# Patient Record
Sex: Male | Born: 2000
Health system: Southern US, Community
[De-identification: ages and names within clinical notes are randomized; demographics above are authoritative.]

## PROBLEM LIST (undated history)

## (undated) HISTORY — PX: UMBILICAL HERNIA REPAIR: SUR1181

## (undated) HISTORY — PX: HERNIA REPAIR: SHX51

---
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
## 2019-07-13 ENCOUNTER — Emergency Department (HOSPITAL_COMMUNITY)
Admission: EM | Admit: 2019-07-13 | Discharge: 2019-07-14 | Disposition: A | Discharge status: Home / Self Care | Payer: Medicaid Other | Attending: Emergency Medicine | Admitting: Emergency Medicine

## 2019-07-13 DIAGNOSIS — Y929 Unspecified place or not applicable: Secondary | ICD-10-CM | POA: Insufficient documentation

## 2019-07-13 DIAGNOSIS — W260XXA Contact with knife, initial encounter: Secondary | ICD-10-CM | POA: Diagnosis not present

## 2019-07-13 DIAGNOSIS — Y93G1 Activity, food preparation and clean up: Secondary | ICD-10-CM | POA: Diagnosis not present

## 2019-07-13 DIAGNOSIS — S61512A Laceration without foreign body of left wrist, initial encounter: Secondary | ICD-10-CM | POA: Diagnosis not present

## 2019-07-13 DIAGNOSIS — Y999 Unspecified external cause status: Secondary | ICD-10-CM | POA: Insufficient documentation
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
## 2019-08-27 ENCOUNTER — Other Ambulatory Visit: Payer: Self-pay

## 2019-08-27 ENCOUNTER — Emergency Department (HOSPITAL_COMMUNITY)
Admission: EM | Admit: 2019-08-27 | Discharge: 2019-08-27 | Discharge status: Against Medical Advice | Payer: Medicaid Other

## 2019-08-27 NOTE — ED Notes (Signed)
Pt brought back to triage, he took his bandage off and started bleeding. Laceration noted to L middle finger. Pt would not stay still for a proper assessment. We rewrapped it and he stated that he was not waiting to be seen. Pt encouraged to stay. He said that he would come back next week. Pt was educated that there is a time frame in which he can get stitched. He said "fuck that I'm not waiting." Pt once again encouraged to stay d/t infection risk and potential healing issues.

## 2019-09-04 ENCOUNTER — Ambulatory Visit
Admission: EM | Admit: 2019-09-04 | Discharge: 2019-09-04 | Disposition: A | Discharge status: Home / Self Care | Payer: Medicaid Other | Attending: Emergency Medicine | Admitting: Emergency Medicine

## 2019-09-04 DIAGNOSIS — S61219A Laceration without foreign body of unspecified finger without damage to nail, initial encounter: Secondary | ICD-10-CM

## 2019-09-04 DIAGNOSIS — Z23 Encounter for immunization: Secondary | ICD-10-CM

## 2019-09-04 MED ORDER — TETANUS-DIPHTH-ACELL PERTUSSIS 5-2.5-18.5 LF-MCG/0.5 IM SUSP
0.5000 mL | Freq: Once | INTRAMUSCULAR | Status: AC
  Administered 2019-09-04: 0.5 mL via INTRAMUSCULAR

## 2019-09-04 NOTE — ED Provider Notes (Signed)
[facility]    CSN: [id number] Arrival date & time: 09/04/19  1010      History   Chief Complaint Chief Complaint  Patient presents with  . Laceration    HPI James Baldwin is a 19 y.o. male presenting for left middle finger laceration.  This occurred 5/27: went to the ER, though left prior to being seen (please see ED note).  Patient unsure of last tetanus.  Endorsing mild pain, did not take anything for this.  No fever, numbness.  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   History reviewed. No pertinent past medical history.  There are no problems to display for this patient.   Past Surgical History:  Procedure Laterality Date  . UMBILICAL HERNIA REPAIR         Home Medications    Prior to Admission medications   Not on File    Family History History reviewed. No pertinent family history.  Social History Social History   Tobacco Use  . Smoking status: Current Every Day Smoker    Types: Cigarettes  . Smokeless tobacco: Current User  Substance Use Topics  . Alcohol use: No  . Drug use: Yes    Types: Marijuana     Allergies   Patient has no known allergies.   Review of Systems As per HPI   Physical Exam Triage Vital Signs ED Triage Vitals  Enc Vitals Group     BP      Pulse      Resp      Temp      Temp src      SpO2      Weight      Height      Head Circumference      Peak Flow      Pain Score      Pain Loc      Pain Edu?      Excl. in GC?    No data found.  Updated Vital Signs BP 115/73 (BP Location: Left Arm)   Pulse 97   Temp 98.7 F (37.1 C) (Oral)   Resp 16   SpO2 98%   Visual Acuity Right Eye Distance:   Left Eye Distance:   Bilateral Distance:    Right Eye Near:   Left Eye Near:    Bilateral Near:     Physical Exam Constitutional:      General: He is not in acute distress. HENT:     Head: Normocephalic and atraumatic.  Eyes:     General: No scleral icterus.  Pupils: Pupils are equal, round, and reactive to light.  Cardiovascular:     Rate and Rhythm: Normal rate.  Pulmonary:     Effort: Pulmonary effort is normal. No respiratory distress.     Breath sounds: No wheezing.  Musculoskeletal:        General: Tenderness present. No swelling. Normal range of motion.  Skin:    Coloration: Skin is not jaundiced or pale.     Comments: 1.5 cm laceration noted to left middle finger.  Healing well without discharge, warmth.  NVI  Neurological:     Mental Status: He is alert and oriented to person, place, and time.      UC Treatments / Results  Labs (all labs ordered are listed, but only abnormal results are displayed) Labs Reviewed - No data to display  EKG   Radiology No results found.  Procedures Procedures (  including critical care time)  Medications Ordered in UC Medications  Tdap (BOOSTRIX) injection 0.5 mL (has no administration in time range)    Initial Impression / Assessment and Plan / UC Course  I have reviewed the triage vital signs and the nursing notes.  Pertinent labs & imaging results that were available during my care of the patient were reviewed by me and considered in my medical decision making (see chart for details).     Low concern for infectious process at this time.  We will continue secondary healing.  Tetanus updated.  Wound dressed and wound care discussed.  Return precautions discussed, patient verbalized understanding and is agreeable to plan. Final Clinical Impressions(s) / UC Diagnoses   Final diagnoses:  Finger laceration, initial encounter     Discharge Instructions     Keep wound clean and dry. Important to keep covered while at work. May take Tylenol or ibuprofen for pain. Return for worsening pain, swelling, discharge, fever.    ED Prescriptions    None     PDMP not reviewed this encounter.   Hall-Potvin, [place], [place] 09/04/19 1034

## 2019-09-04 NOTE — Discharge Instructions (Signed)
Keep wound clean and dry. Important to keep covered while at work. May take Tylenol or ibuprofen for pain. Return for worsening pain, swelling, discharge, fever.
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
## 2019-09-06 ENCOUNTER — Ambulatory Visit
Admission: EM | Admit: 2019-09-06 | Discharge: 2019-09-06 | Disposition: A | Discharge status: Home / Self Care | Payer: Medicaid Other | Attending: Emergency Medicine | Admitting: Emergency Medicine

## 2019-09-06 ENCOUNTER — Ambulatory Visit (INDEPENDENT_AMBULATORY_CARE_PROVIDER_SITE_OTHER): Payer: Medicaid Other

## 2019-09-06 ENCOUNTER — Encounter: Payer: Self-pay | Admitting: *Deleted

## 2019-09-06 ENCOUNTER — Other Ambulatory Visit: Payer: Self-pay

## 2019-09-06 DIAGNOSIS — S61411A Laceration without foreign body of right hand, initial encounter: Secondary | ICD-10-CM

## 2019-09-06 DIAGNOSIS — M79644 Pain in right finger(s): Secondary | ICD-10-CM | POA: Diagnosis not present

## 2019-09-06 DIAGNOSIS — S6991XA Unspecified injury of right wrist, hand and finger(s), initial encounter: Secondary | ICD-10-CM

## 2019-09-06 NOTE — Discharge Instructions (Signed)
Keep wound clean and dry. Important apply ice as discussed top with pain and swelling. May also take ibuprofen, Tylenol as needed. Return for worsening pain, swelling, discharge, smell, fever.
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
## 2019-10-22 ENCOUNTER — Ambulatory Visit: Payer: Medicaid Other | Admitting: Internal Medicine

## 2020-04-08 ENCOUNTER — Encounter: Payer: Self-pay | Admitting: Emergency Medicine

## 2020-04-08 ENCOUNTER — Other Ambulatory Visit: Payer: Self-pay

## 2020-04-08 ENCOUNTER — Ambulatory Visit
Admission: EM | Admit: 2020-04-08 | Discharge: 2020-04-08 | Disposition: A | Discharge status: Home / Self Care | Payer: Medicaid Other | Attending: Emergency Medicine | Admitting: Emergency Medicine

## 2020-04-08 DIAGNOSIS — B349 Viral infection, unspecified: Secondary | ICD-10-CM | POA: Diagnosis not present

## 2020-04-08 DIAGNOSIS — R52 Pain, unspecified: Secondary | ICD-10-CM | POA: Diagnosis not present

## 2020-04-08 MED ORDER — IBUPROFEN 800 MG PO TABS: 800.0000 mg | ORAL_TABLET | Freq: Three times a day (TID) | ORAL | 0 refills | Status: AC

## 2020-04-08 NOTE — ED Provider Notes (Signed)
[facility]    CSN: [id number] Arrival date & time: 04/08/20  0954      History   Chief Complaint Chief Complaint  Patient presents with  . Sore Throat  . Generalized Body Aches    HPI James Baldwin is a 20 y.o. male presenting today for evaluation of sore throat headache and body aches.  Symptoms began in the past 1 to 2 days.  Reports associated fatigue.  Denies significant cough or congestion.  Main complaint is aches in his lower legs bilaterally.   HPI  History reviewed. No pertinent past medical history.  There are no problems to display for this patient.   Past Surgical History:  Procedure Laterality Date  . HERNIA REPAIR    . UMBILICAL HERNIA REPAIR         Home Medications    Prior to Admission medications   Medication Sig Start Date End Date Taking? Authorizing Provider  ibuprofen (ADVIL) 800 MG tablet Take 1 tablet (800 mg total) by mouth 3 (three) times daily. 04/08/20  Yes Timiya Howells, Junius Creamer, PA-C    Family History Family History  Problem Relation Age of Onset  . Healthy Mother   . Healthy Father     Social History Social History   Tobacco Use  . Smoking status: Current Every Day Smoker    Types: Cigarettes  . Smokeless tobacco: Current User  Vaping Use  . Vaping Use: Never used  Substance Use Topics  . Alcohol use: No  . Drug use: Yes    Types: Marijuana     Allergies   Patient has no known allergies.   Review of Systems Review of Systems  Constitutional: Positive for fatigue. Negative for activity change, appetite change, chills and fever.  HENT: Positive for sore throat. Negative for congestion, ear pain, rhinorrhea, sinus pressure and trouble swallowing.   Eyes: Negative for discharge and redness.  Respiratory: Negative for cough, chest tightness and shortness of breath.   Cardiovascular: Negative for chest pain.  Gastrointestinal: Negative for abdominal pain, diarrhea, nausea and vomiting.  Musculoskeletal:  Negative for myalgias.  Skin: Negative for rash.  Neurological: Positive for headaches. Negative for dizziness and light-headedness.     Physical Exam Triage Vital Signs ED Triage Vitals  Enc Vitals Group     BP 04/08/20 1006 116/73     Pulse Rate 04/08/20 1006 94     Resp 04/08/20 1006 15     Temp 04/08/20 1006 98.6 F (37 C)     Temp Source 04/08/20 1006 Oral     SpO2 04/08/20 1006 99 %     Weight 04/08/20 1005 170 lb (77.1 kg)     Height 04/08/20 1005 6\' 3"  (1.905 m)     Head Circumference --      Peak Flow --      Pain Score 04/08/20 1005 4     Pain Loc --      Pain Edu? --      Excl. in GC? --    No data found.  Updated Vital Signs BP 116/73 (BP Location: Left Arm)   Pulse 94   Temp 98.6 F (37 C) (Oral)   Resp 15   Ht 6\' 3"  (1.905 m)   Wt 170 lb (77.1 kg)   SpO2 99%   BMI 21.25 kg/m   Visual Acuity Right Eye Distance:   Left Eye Distance:   Bilateral Distance:    Right Eye Near:   Left Eye Near:  Bilateral Near:     Physical Exam Vitals and nursing note reviewed.  Constitutional:      Appearance: He is well-developed and well-nourished.     Comments: No acute distress  HENT:     Head: Normocephalic and atraumatic.     Ears:     Comments: Bilateral ears without tenderness to palpation of external auricle, tragus and mastoid, EAC's without erythema or swelling, TM's with good bony landmarks and cone of light. Non erythematous.     Nose: Nose normal.     Mouth/Throat:     Comments: Oral mucosa pink and moist, no tonsillar enlargement or exudate. Posterior pharynx patent and nonerythematous, no uvula deviation or swelling. Normal phonation. Eyes:     Conjunctiva/sclera: Conjunctivae normal.  Cardiovascular:     Rate and Rhythm: Normal rate.  Pulmonary:     Effort: Pulmonary effort is normal. No respiratory distress.     Comments: Breathing comfortably at rest, CTABL, no wheezing, rales or other adventitious sounds auscultated Abdominal:      General: There is no distension.  Musculoskeletal:        General: Normal range of motion.     Cervical back: Neck supple.  Skin:    General: Skin is warm and dry.  Neurological:     Mental Status: He is alert and oriented to person, place, and time.  Psychiatric:        Mood and Affect: Mood and affect normal.      UC Treatments / Results  Labs (all labs ordered are listed, but only abnormal results are displayed) Labs Reviewed  NOVEL CORONAVIRUS, NAA    EKG   Radiology No results found.  Procedures Procedures (including critical care time)  Medications Ordered in UC Medications - No data to display  Initial Impression / Assessment and Plan / UC Course  I have reviewed the triage vital signs and the nursing notes.  Pertinent labs & imaging results that were available during my care of the patient were reviewed by me and considered in my medical decision making (see chart for details).     Viral illness-Covid test pending, recommending symptomatic and supportive care rest and fluids.  Discussed strict return precautions. Patient verbalized understanding and is agreeable with plan.  Final Clinical Impressions(s) / UC Diagnoses   Final diagnoses:  Viral illness  Generalized body aches     Discharge Instructions     Covid test pending, monitor my chart for results Ibuprofen and Tylenol for headache body aches, sore throat May use over-the-counter cough and congestion medicine as needed Rest and fluids Follow-up if not improving or worsening    ED Prescriptions    Medication Sig Dispense Auth. Provider   ibuprofen (ADVIL) 800 MG tablet Take 1 tablet (800 mg total) by mouth 3 (three) times daily. 21 tablet Abdinasir Spadafore, [place] C, PA-C     PDMP not reviewed this encounter.   Lew Dawes, PA-C 04/08/20 1105

## 2020-04-08 NOTE — ED Triage Notes (Signed)
Patient c/o chills, generalized body aches, sore throat, and headache x 1 day.   Patient denies fever at home.   Patient endorses "I feel like I have a ball in my throat".

## 2020-04-08 NOTE — Discharge Instructions (Signed)
Covid test pending, monitor my chart for results Ibuprofen and Tylenol for headache body aches, sore throat May use over-the-counter cough and congestion medicine as needed Rest and fluids Follow-up if not improving or worsening

## 2020-04-12 LAB — NOVEL CORONAVIRUS, NAA: SARS-CoV-2, NAA: DETECTED — AB
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
## 2021-12-23 ENCOUNTER — Emergency Department (HOSPITAL_COMMUNITY)
Admission: EM | Admit: 2021-12-23 | Discharge: 2021-12-23 | Disposition: A | Discharge status: Home / Self Care | Payer: Self-pay | Attending: Emergency Medicine | Admitting: Emergency Medicine

## 2021-12-23 ENCOUNTER — Emergency Department (HOSPITAL_COMMUNITY): Payer: Medicaid Other

## 2021-12-23 ENCOUNTER — Other Ambulatory Visit: Payer: Self-pay

## 2021-12-23 ENCOUNTER — Encounter (HOSPITAL_COMMUNITY): Payer: Self-pay | Admitting: Emergency Medicine

## 2021-12-23 ENCOUNTER — Emergency Department (HOSPITAL_COMMUNITY): Payer: Self-pay

## 2021-12-23 DIAGNOSIS — S0001XA Abrasion of scalp, initial encounter: Secondary | ICD-10-CM | POA: Insufficient documentation

## 2021-12-23 DIAGNOSIS — R519 Headache, unspecified: Secondary | ICD-10-CM | POA: Insufficient documentation

## 2021-12-23 DIAGNOSIS — S20219A Contusion of unspecified front wall of thorax, initial encounter: Secondary | ICD-10-CM | POA: Insufficient documentation

## 2021-12-23 DIAGNOSIS — Y92410 Unspecified street and highway as the place of occurrence of the external cause: Secondary | ICD-10-CM | POA: Diagnosis not present

## 2021-12-23 DIAGNOSIS — S299XXA Unspecified injury of thorax, initial encounter: Secondary | ICD-10-CM | POA: Diagnosis present

## 2021-12-23 LAB — CBC WITH DIFFERENTIAL/PLATELET
Abs Immature Granulocytes: 0.03 10*3/uL (ref 0.00–0.07)
Basophils Absolute: 0 10*3/uL (ref 0.0–0.1)
Basophils Relative: 0 %
Eosinophils Absolute: 0 10*3/uL (ref 0.0–0.5)
Eosinophils Relative: 0 %
HCT: 36 % — ABNORMAL LOW (ref 39.0–52.0)
Hemoglobin: 11.8 g/dL — ABNORMAL LOW (ref 13.0–17.0)
Immature Granulocytes: 0 %
Lymphocytes Relative: 10 %
Lymphs Abs: 1 10*3/uL (ref 0.7–4.0)
MCH: 22.8 pg — ABNORMAL LOW (ref 26.0–34.0)
MCHC: 32.8 g/dL (ref 30.0–36.0)
MCV: 69.6 fL — ABNORMAL LOW (ref 80.0–100.0)
Monocytes Absolute: 0.6 10*3/uL (ref 0.1–1.0)
Monocytes Relative: 7 %
Neutro Abs: 7.9 10*3/uL — ABNORMAL HIGH (ref 1.7–7.7)
Neutrophils Relative %: 83 %
Platelets: 168 10*3/uL (ref 150–400)
RBC: 5.17 MIL/uL (ref 4.22–5.81)
RDW: 15 % (ref 11.5–15.5)
WBC: 9.6 10*3/uL (ref 4.0–10.5)
nRBC: 0 % (ref 0.0–0.2)

## 2021-12-23 LAB — COMPREHENSIVE METABOLIC PANEL
ALT: 14 U/L (ref 0–44)
AST: 21 U/L (ref 15–41)
Albumin: 4.3 g/dL (ref 3.5–5.0)
Alkaline Phosphatase: 54 U/L (ref 38–126)
Anion gap: 10 (ref 5–15)
BUN: 10 mg/dL (ref 6–20)
CO2: 22 mmol/L (ref 22–32)
Calcium: 9.5 mg/dL (ref 8.9–10.3)
Chloride: 108 mmol/L (ref 98–111)
Creatinine, Ser: 1.08 mg/dL (ref 0.61–1.24)
GFR, Estimated: >60 mL/min (ref >60)
Glucose, Bld: 91 mg/dL (ref 70–99)
Potassium: 3.7 mmol/L (ref 3.5–5.1)
Sodium: 140 mmol/L (ref 135–145)
Total Bilirubin: 0.5 mg/dL (ref 0.3–1.2)
Total Protein: 6.8 g/dL (ref 6.5–8.1)

## 2021-12-23 MED ORDER — IOHEXOL 350 MG/ML SOLN
75.0000 mL | Freq: Once | INTRAVENOUS | Status: AC | PRN
  Administered 2021-12-23: 75 mL via INTRAVENOUS
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
## 2021-12-23 NOTE — ED Notes (Signed)
Warm washcloth given to clean face

## 2022-12-02 ENCOUNTER — Other Ambulatory Visit: Payer: Self-pay

## 2022-12-02 ENCOUNTER — Emergency Department (HOSPITAL_COMMUNITY)
Admission: EM | Admit: 2022-12-02 | Discharge: 2022-12-02 | Discharge status: Against Medical Advice | Payer: Medicaid Other

## 2022-12-02 DIAGNOSIS — Z5329 Procedure and treatment not carried out because of patient's decision for other reasons: Secondary | ICD-10-CM | POA: Insufficient documentation

## 2022-12-02 DIAGNOSIS — K625 Hemorrhage of anus and rectum: Secondary | ICD-10-CM

## 2022-12-02 DIAGNOSIS — K921 Melena: Secondary | ICD-10-CM | POA: Insufficient documentation

## 2022-12-02 DIAGNOSIS — D649 Anemia, unspecified: Secondary | ICD-10-CM | POA: Insufficient documentation

## 2022-12-02 LAB — TYPE AND SCREEN
ABO/RH(D): O POS
Antibody Screen: NEGATIVE

## 2022-12-02 LAB — COMPREHENSIVE METABOLIC PANEL
ALT: 13 U/L (ref 0–44)
AST: 25 U/L (ref 15–41)
Albumin: 4.2 g/dL (ref 3.5–5.0)
Alkaline Phosphatase: 65 U/L (ref 38–126)
Anion gap: 11 (ref 5–15)
BUN: 9 mg/dL (ref 6–20)
CO2: 22 mmol/L (ref 22–32)
Calcium: 8.9 mg/dL (ref 8.9–10.3)
Chloride: 104 mmol/L (ref 98–111)
Creatinine, Ser: 0.91 mg/dL (ref 0.61–1.24)
GFR, Estimated: >60 mL/min (ref >60)
Glucose, Bld: 82 mg/dL (ref 70–99)
Potassium: 4 mmol/L (ref 3.5–5.1)
Sodium: 137 mmol/L (ref 135–145)
Total Bilirubin: 0.2 mg/dL — ABNORMAL LOW (ref 0.3–1.2)
Total Protein: 7.1 g/dL (ref 6.5–8.1)

## 2022-12-02 LAB — CBC
HCT: 40.5 % (ref 39.0–52.0)
Hemoglobin: 12.9 g/dL — ABNORMAL LOW (ref 13.0–17.0)
MCH: 22.8 pg — ABNORMAL LOW (ref 26.0–34.0)
MCHC: 31.9 g/dL (ref 30.0–36.0)
MCV: 71.6 fL — ABNORMAL LOW (ref 80.0–100.0)
Platelets: 234 10*3/uL (ref 150–400)
RBC: 5.66 MIL/uL (ref 4.22–5.81)
RDW: 15.4 % (ref 11.5–15.5)
WBC: 5.3 10*3/uL (ref 4.0–10.5)
nRBC: 0 % (ref 0.0–0.2)

## 2022-12-02 NOTE — ED Provider Notes (Signed)
[place] EMERGENCY DEPARTMENT AT [facility] Provider Note   CSN: [id number] Arrival date & time: 12/02/22  1624     History  Chief Complaint  Patient presents with   Rectal Bleeding    James Baldwin is a 22 y.o. male.  Otherwise healthy 22 year old male present emergency department with bloody stools for the past 3 days.  Reports nonpainful defecation.  Has bright red blood in toilet bowl.  No lightheadedness no dizziness, no chest pain no shortness of breath no abdominal pain.  No trauma.   Rectal Bleeding      Home Medications Prior to Admission medications   Medication Sig Start Date End Date Taking? Authorizing Provider  ibuprofen (ADVIL) 800 MG tablet Take 1 tablet (800 mg total) by mouth 3 (three) times daily. 04/08/20   Wieters, Hallie C, PA-C      Allergies    Patient has no known allergies.    Review of Systems   Review of Systems  Gastrointestinal:  Positive for hematochezia.    Physical Exam Updated Vital Signs BP 121/81   Pulse 72   Temp 97.7 F (36.5 C) (Oral)   Resp 18   Ht 6\' 3"  (1.905 m)   Wt 76.2 kg   SpO2 100%   BMI 21.00 kg/m  Physical Exam Vitals and nursing note reviewed.  Constitutional:      General: He is not in acute distress. HENT:     Head: Normocephalic.     Nose: Nose normal.     Mouth/Throat:     Mouth: Mucous membranes are moist.  Eyes:     Conjunctiva/sclera: Conjunctivae normal.  Cardiovascular:     Rate and Rhythm: Normal rate and regular rhythm.  Pulmonary:     Effort: Pulmonary effort is normal.     Breath sounds: Normal breath sounds.  Abdominal:     General: Abdomen is flat.     Tenderness: There is no abdominal tenderness. There is no guarding or rebound.  Genitourinary:    Rectum: Normal.  Skin:    General: Skin is warm and dry.     Capillary Refill: Capillary refill takes less than 2 seconds.  Neurological:     Mental Status: He is alert and oriented to person, place, and time.   Psychiatric:        Mood and Affect: Mood normal.        Behavior: Behavior normal.     ED Results / Procedures / Treatments   Labs (all labs ordered are listed, but only abnormal results are displayed) Labs Reviewed  COMPREHENSIVE METABOLIC PANEL - Abnormal; Notable for the following components:      Result Value   Total Bilirubin 0.2 (*)    All other components within normal limits  CBC - Abnormal; Notable for the following components:   Hemoglobin 12.9 (*)    MCV 71.6 (*)    MCH 22.8 (*)    All other components within normal limits  POC OCCULT BLOOD, ED  TYPE AND SCREEN    EKG None  Radiology No results found.  Procedures Procedures    Medications Ordered in ED Medications - No data to display  ED Course/ Medical Decision Making/ A&P Clinical Course as of 12/02/22 2346  [facility] Dec 02, 2022  2052 Potassium: 4.0 [TY]    Clinical Course User Index [TY] Coral Spikes, DO  Medical Decision Making Otherwise healthy 22 year old male presenting emergency department bloody bowel movements.  Is afebrile nontachycardic hemodynamic stable.  Physical exam with no external hemorrhoids.  Offered digital rectal exam, patient declined.  He is minor anemia 12.9.  No significant metabolic derangements..  Creatinine normal.  Discussed follow-up with GI and return precautions.  Patient agreeable to plan.  He had worsening left AMA/eloped prior to receiving discharge paperwork.  Amount and/or Complexity of Data Reviewed Labs: ordered. Decision-making details documented in ED Course.          Final Clinical Impression(s) / ED Diagnoses Final diagnoses:  None    Rx / DC Orders ED Discharge Orders     None         Coral Spikes, DO 12/02/22 2346

## 2022-12-02 NOTE — ED Triage Notes (Signed)
Pt arrived via POV. C/o rectal bleeding w/bright red blood for past week. No c/o pain.  AOx4

## 2022-12-02 NOTE — ED Notes (Addendum)
Pt walked out if room, reports he is leaving and will see a stomach doctor in the morning. Pt ambulatory , RR even and unlabored. Pt signed out AMA , provider aware

## 2024-04-10 ENCOUNTER — Ambulatory Visit (HOSPITAL_COMMUNITY): Payer: Self-pay | Admitting: Clinical

## 2024-04-10 ENCOUNTER — Ambulatory Visit (INDEPENDENT_AMBULATORY_CARE_PROVIDER_SITE_OTHER): Admitting: Clinical

## 2024-04-10 DIAGNOSIS — F1020 Alcohol dependence, uncomplicated: Secondary | ICD-10-CM

## 2024-04-10 DIAGNOSIS — F1120 Opioid dependence, uncomplicated: Secondary | ICD-10-CM | POA: Diagnosis not present

## 2024-04-10 DIAGNOSIS — F19951 Other psychoactive substance use, unspecified with psychoactive substance-induced psychotic disorder with hallucinations: Secondary | ICD-10-CM

## 2024-04-10 DIAGNOSIS — F1510 Other stimulant abuse, uncomplicated: Secondary | ICD-10-CM

## 2024-04-10 DIAGNOSIS — F1220 Cannabis dependence, uncomplicated: Secondary | ICD-10-CM

## 2024-04-10 NOTE — Progress Notes (Unsigned)
 Comprehensive Clinical Assessment (CCA) Note  04/10/2024 GAUTHAM HEWINS [id number]  Virtual Visit via Video Note  I connected with Gershon ONEIDA Berg on 04/10/2024 at  3:00 PM EST by a video enabled telemedicine application and verified that I am speaking with the correct person using two identifiers.  Location: Patient: home Provider: [id number]   I discussed the limitations of evaluation and management by telemedicine and the availability of in person appointments. The patient expressed understanding and agreed to proceed.   Follow Up Instructions: I discussed the assessment and treatment plan with the patient. The patient was provided an opportunity to ask questions and all were answered. The patient agreed with the plan and demonstrated an understanding of the instructions.   The patient was advised to call back or seek an in-person evaluation if the symptoms worsen or if the condition fails to improve as anticipated.  I provided 25 minutes of non-face-to-face time during this encounter.   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 Client is presenting by referral of [facility]. Client reported he is presenting because he wants to get his mental health checked out. Client reported he has not spoken with the mental health professional since 2017. Client reported in 2017 is when he first vocalized he was experiencing auditory hallucinations. Client reported he has a history of opiate, marijuana and alcohol use on a consistent basis. Client reported he is not using  methamphetamine at this time. Client reported that he hears his name being said.  Client reported he has even gone as far as going across town to find where his name is being said.  Client reported he has also had thoughts of hurting other people.  Client reported he has never made specific plan to intentionally harm any particular persons nor has he done so.  Client reported however having the thought of what if he were to hit somebody or twist their neck.  Client reported he currently lives with his uncle and he does have support of immediate family including both of his parents and siblings as well as his girlfriend.  Client reported he has no other history of inpatient treatment for mental health and/or substance use reasons. Client presented oriented x 5, appropriately dressed, and cooperative.  Client reported auditory hallucinations and denied delusions.  Client denied suicidal and homicidal ideations.  Client was screened for pain, nutrition, [place] suicide severity and the following SDOH: Advertising Copywriter from 04/10/2024 in [facility]  PHQ-9 Total Score 3       04/10/2024    3:23 PM  GAD 7 : Generalized Anxiety Score  Nervous, Anxious, on Edge 2  Control/stop worrying 2  Worry too much - different things 2  Trouble relaxing 1  Restless 0  Easily annoyed or irritable 0  Afraid - awful might happen 0  Total GAD 7 Score 7  Anxiety Difficulty Somewhat difficult   Treatment recommendations: Therapist referred the client to [facility] [facility] treatment to establish for their detox program before starting on psychiatric medications and engaging in outpatient substance use group which he expressed  interest in.  Client was in agreement with the recommendation.  Therapist also provided the client with the phone number to the 24-hour mobile crisis phone number and informed him of the urgent care at this location.    CCA  Biopsychosocial Intake/Chief Complaint:  client reported he is referred by [facility]. client reported he wants to get his mental health checked out. client reported he experiences auditory hallucinations and wanting to hurt others. client reported he has violated probation twice for using marijuana.  Current Symptoms/Problems: client rported he mainly hears his name and forgets where he is sometimes. client reported he hears his name and goes lookingf or it. client reported he has ended somewhere across town before due to that. client reported he believes the hallucinations began in 2017 when he first told a professional about it. client reported in his head he has thoughts of what if i hurt this person. client reported he does not have a specific to harm anyone.  Patient Reported Schizophrenia/Schizoaffective Diagnosis in Past: No  Strengths: engaging in treatment planning  Preferences: counseling and medication management  Abilities: discuss mental and substance use history   Type of Services Patient Feels are Needed: medication   Initial Clinical Notes/Concerns: client reported he sleeps for 2 hours at a time. client reported he eats 1 pill.   Mental Health Symptoms Depression:  Change in energy/activity   Duration of Depressive symptoms: Greater than two weeks   Mania:  None   Anxiety:   Tension   Psychosis:  None   Duration of Psychotic symptoms: No data recorded  Trauma:  None   Obsessions:  None   Compulsions:  None   Inattention:  None   Hyperactivity/Impulsivity:  None   Oppositional/Defiant Behaviors:  None   Emotional Irregularity:  None   Other Mood/Personality Symptoms:  No data recorded   Mental Status Exam Appearance and self-care  Stature:  Tall   Weight:  Average weight   Clothing:  Casual   Grooming:  Normal   Cosmetic use:  Age appropriate   Posture/gait:  Normal   Motor activity:  Not Remarkable   Sensorium  Attention:  Normal    Concentration:  Normal   Orientation:  X5   Recall/memory:  Normal   Affect and Mood  Affect:  Congruent   Mood:  Euthymic   Relating  Eye contact:  Normal   Facial expression:  Responsive   Attitude toward examiner:  Cooperative   Thought and Language  Speech flow: Clear and Coherent   Thought content:  Appropriate to Mood and Circumstances   Preoccupation:  Homicidal   Hallucinations:  Auditory   Organization:  No data recorded  [employer] of Knowledge:  Good   Intelligence:  Average   Abstraction:  Normal   Judgement:  Good   Reality Testing:  Adequate   Insight:  Good   Decision Making:  Normal   Social Functioning  Social Maturity:  Isolates   Social Judgement:  Chief Of Staff   Stress  Stressors:  Optometrist Ability:  Resilient   Skill Deficits:  Activities of daily living   Supports:  Family     Religion: Religion/Spirituality Are You A Religious Person?: No  Leisure/Recreation: Leisure / Recreation Do You Have Hobbies?: No  Exercise/Diet: Exercise/Diet Do You Exercise?: No Have You Gained or Lost A Significant Amount of Weight in the Past Six Months?: No Do You Follow a Special Diet?: No Do You Have Any Trouble  Sleeping?: Yes   CCA Employment/Education Employment/Work Situation: Employment / Work Situation Employment Situation: Unemployed  Education: Education Last Grade Completed: 8 Did Garment/textile Technologist From [employer]?: No   CCA Family/Childhood History Family and Relationship History: Family history Marital status: Long term relationship  Childhood History:  Childhood History Additional childhood history information: client reported he is from [place]. Client reported he was raised by his grandparents. client reported he had a good childhood but towards the end he found out the people he called his parents werent his parents. Patient's description of current relationship with people who  raised him/her: good Does patient have siblings?: Yes Number of Siblings: 12 Did patient suffer any verbal/emotional/physical/sexual abuse as a child?: No Did patient suffer from severe childhood neglect?: No Has patient ever been sexually abused/assaulted/raped as an adolescent or adult?: No Was the patient ever a victim of a crime or a disaster?: No Witnessed domestic violence?: No Has patient been affected by domestic violence as an adult?: No  Child/Adolescent Assessment:     CCA Substance Use Alcohol/Drug Use: Alcohol / Drug Use History of alcohol / drug use?: Yes Substance #1 Name of Substance 1: opiates: e pills, percocet, oxycodone 1 - Age of First Use: ukn 1 - Frequency: weekly 1 - Method of Aquiring: illegal 1- Route of Use: oral Substance #2 Name of Substance 2: marijuana 2 - Age of First Use: ukn 2 - Amount (size/oz): varies 2 - Frequency: weekly 2 - Last Use / Amount: ukn 2 - Method of Aquiring: illegal 2 - Route of Substance Use: smoking Substance #3 Name of Substance 3: alcohol 3 - Amount (size/oz): varies 3 - Frequency: weekly 3 - Last Use / Amount: ukn 3 - Method of Aquiring: himself 3 - Route of Substance Use: oral Substance #4 Name of Substance 4: methamphetamine 4 - Amount (size/oz): varies 4 - Last Use / Amount: ukn 4 - Method of Aquiring: illegal 4 - Route of Substance Use: ukn                 ASAM's:  Six Dimensions of Multidimensional Assessment  Dimension 1:  Acute Intoxication and/or Withdrawal Potential:   Dimension 1:  Description of individual's past and current experiences of substance use and withdrawal: client reported no hx of detox treatment for substance use and denies withdrawl symptoms.  Dimension 2:  Biomedical Conditions and Complications:   Dimension 2:  Description of patient's biomedical conditions and  complications: client reported no medical conditions.  Dimension 3:  Emotional, Behavioral, or Cognitive  Conditions and Complications:  Dimension 3:  Description of emotional, behavioral, or cognitive conditions and complications: client reported issues with intrusive thoughts.  Dimension 4:  Readiness to Change:  Dimension 4:  Description of Readiness to Change criteria: client is in the action stage of change.  Dimension 5:  Relapse, Continued use, or Continued Problem Potential:  Dimension 5:  Relapse, continued use, or continued problem potential critiera description: client reported currently using substances on a weekly basis.  Dimension 6:  Recovery/Living Environment:  Dimension 6:  Recovery/Iiving environment criteria description: client reported he lives with his uncle.  ASAM Severity Score: ASAM's Severity Rating Score: 8  ASAM Recommended Level of Treatment: ASAM Recommended Level of Treatment: Level III Residential Treatment   Substance use Disorder (SUD) Substance Use Disorder (SUD)  Checklist Symptoms of Substance Use: Continued use despite having a persistent/recurrent physical/psychological problem caused/exacerbated by use, Continued use despite persistent or recurrent social, interpersonal problems, caused or  exacerbated by use, Evidence of tolerance, Persistent desire or unsuccessful efforts to cut down or control use, Presence of craving or strong urge to use  Recommendations for Services/Supports/Treatments: Recommendations for Services/Supports/Treatments Recommendations For Services/Supports/Treatments: Other (Comment)  DSM5 Diagnoses: There are no active problems to display for this patient.   Patient Centered Plan: Patient is on the following Treatment Plan(s):  Substance Abuse   Referrals to Alternative Service(s): Referred to Alternative Service(s):   Place:   Date:   Time:    Referred to Alternative Service(s):   Place:   Date:   Time:    Referred to Alternative Service(s):   Place:   Date:   Time:    Referred to Alternative Service(s):   Place:   Date:   Time:       Collaboration of Care: Other therapist provided the client with information to [facility] in [place] for substance use treatment before follow up with outpatient services.  Patient/Guardian was advised Release of Information must be obtained prior to any record release in order to collaborate their care with an outside provider. Patient/Guardian was advised if they have not already done so to contact the registration department to sign all necessary forms in order for us  to release information regarding their care.   Consent: Patient/Guardian gives verbal consent for treatment and assignment of benefits for services provided during this visit. Patient/Guardian expressed understanding and agreed to proceed.   Ibrahim Mcpheeters Y Takiera Mayo, LCSW

## 2024-05-01 ENCOUNTER — Telehealth (HOSPITAL_COMMUNITY): Payer: Self-pay

## 2024-05-01 NOTE — Telephone Encounter (Signed)
 Wilhemenia Lunger from [facility] emails this therapist to ask for if this patient has engaged in services.  The following email was sent:  James Baldwin 08/12/2000 received a A Clincial Comprehensive Assessment on 04-10-24 by Carlyon Morin, LCSW. His ASAM score met medical necessity for level III, residential care.  Darice Simpler, MS, LMFT, LCAS
